# Patient Record
Sex: Female | Born: 1955
Health system: Southern US, Community
[De-identification: ages and names within clinical notes are randomized; demographics above are authoritative.]

---

## 2016-05-27 ENCOUNTER — Encounter: Payer: Self-pay | Admitting: Internal Medicine

## 2016-07-04 ENCOUNTER — Ambulatory Visit: Payer: Self-pay | Admitting: Internal Medicine

## 2020-08-07 DIAGNOSIS — R300 Dysuria: Secondary | ICD-10-CM | POA: Diagnosis not present

## 2020-08-15 DIAGNOSIS — Z1231 Encounter for screening mammogram for malignant neoplasm of breast: Secondary | ICD-10-CM | POA: Diagnosis not present

## 2020-09-05 DIAGNOSIS — M109 Gout, unspecified: Secondary | ICD-10-CM | POA: Diagnosis not present

## 2020-09-07 DIAGNOSIS — M109 Gout, unspecified: Secondary | ICD-10-CM | POA: Diagnosis not present

## 2020-09-07 DIAGNOSIS — L03031 Cellulitis of right toe: Secondary | ICD-10-CM | POA: Diagnosis not present

## 2020-09-07 DIAGNOSIS — R509 Fever, unspecified: Secondary | ICD-10-CM | POA: Diagnosis not present

## 2020-09-07 DIAGNOSIS — M7731 Calcaneal spur, right foot: Secondary | ICD-10-CM | POA: Diagnosis not present

## 2020-09-07 DIAGNOSIS — M79674 Pain in right toe(s): Secondary | ICD-10-CM | POA: Diagnosis not present

## 2020-09-11 DIAGNOSIS — G9389 Other specified disorders of brain: Secondary | ICD-10-CM | POA: Diagnosis not present

## 2020-09-11 DIAGNOSIS — R42 Dizziness and giddiness: Secondary | ICD-10-CM | POA: Diagnosis not present

## 2020-09-11 DIAGNOSIS — G319 Degenerative disease of nervous system, unspecified: Secondary | ICD-10-CM | POA: Diagnosis not present

## 2020-09-11 DIAGNOSIS — I10 Essential (primary) hypertension: Secondary | ICD-10-CM | POA: Diagnosis not present

## 2020-09-11 DIAGNOSIS — M109 Gout, unspecified: Secondary | ICD-10-CM | POA: Diagnosis not present

## 2020-09-12 DIAGNOSIS — R002 Palpitations: Secondary | ICD-10-CM | POA: Diagnosis not present

## 2020-09-12 DIAGNOSIS — I361 Nonrheumatic tricuspid (valve) insufficiency: Secondary | ICD-10-CM | POA: Diagnosis not present

## 2020-09-12 DIAGNOSIS — R42 Dizziness and giddiness: Secondary | ICD-10-CM | POA: Diagnosis not present

## 2020-09-12 DIAGNOSIS — I10 Essential (primary) hypertension: Secondary | ICD-10-CM | POA: Diagnosis not present

## 2020-09-12 DIAGNOSIS — I6523 Occlusion and stenosis of bilateral carotid arteries: Secondary | ICD-10-CM | POA: Diagnosis not present

## 2020-10-03 DIAGNOSIS — M79673 Pain in unspecified foot: Secondary | ICD-10-CM | POA: Diagnosis not present

## 2020-10-03 DIAGNOSIS — E11319 Type 2 diabetes mellitus with unspecified diabetic retinopathy without macular edema: Secondary | ICD-10-CM | POA: Diagnosis not present

## 2020-10-03 DIAGNOSIS — E785 Hyperlipidemia, unspecified: Secondary | ICD-10-CM | POA: Diagnosis not present

## 2020-10-12 ENCOUNTER — Ambulatory Visit: Payer: Medicare Other | Admitting: Podiatry

## 2020-10-12 ENCOUNTER — Ambulatory Visit: Payer: Medicare Other

## 2020-10-12 ENCOUNTER — Other Ambulatory Visit: Payer: Self-pay

## 2020-10-12 DIAGNOSIS — M109 Gout, unspecified: Secondary | ICD-10-CM

## 2020-10-12 DIAGNOSIS — M79675 Pain in left toe(s): Secondary | ICD-10-CM

## 2020-10-12 DIAGNOSIS — M7752 Other enthesopathy of left foot: Secondary | ICD-10-CM | POA: Diagnosis not present

## 2020-10-12 DIAGNOSIS — M1A0720 Idiopathic chronic gout, left ankle and foot, without tophus (tophi): Secondary | ICD-10-CM | POA: Diagnosis not present

## 2020-10-12 DIAGNOSIS — M7989 Other specified soft tissue disorders: Secondary | ICD-10-CM

## 2020-10-12 MED ORDER — BETAMETHASONE SOD PHOS & ACET 6 (3-3) MG/ML IJ SUSP
3.0000 mg | Freq: Once | INTRAMUSCULAR | Status: AC
  Administered 2020-10-12: 3 mg

## 2020-10-12 NOTE — Progress Notes (Signed)
  Subjective:  Patient ID: Megan Oliver, female    DOB: 10-10-1955,  MRN: [id number]  No chief complaint on file.   65 y.o. female presents with the above complaint. History confirmed with patient. Reports pain in the left big toe x1 week with associated bunion. Endorses hx of gout, has had it in both big toes before. Also complains of split along the end of the left big toe x6 months has attempted scraping it herself.  Objective:  Physical Exam: warm, good capillary refill, no trophic changes or ulcerative lesions, normal DP and PT pulses, and normal sensory exam. General xerosis Left Foot: POP left 1st MPJ with local warmth and erythema. Skin fissure with underlying healed skin upon debridement left hallux distal Right Foot: HAV with HPK along medial hallux.  Assessment:   1. Chronic gout of left foot, unspecified cause   2. Capsulitis of metatarsophalangeal (MTP) joint of left foot   3. Pain and swelling of toe of left foot      Plan:  Patient was evaluated and treated and all questions answered.  Gout left foot -Educated on etiology -XR from [facility] reviewed with patient -Injection delivered to the painful joint  Procedure: Joint Injection Location: Left 1st MP joint Skin Prep: Alcohol. Injectate: 0.5 cc 1% lidocaine plain, 0.5 cc betamethasone acetate-betamethasone sodium phosphate Disposition: Patient tolerated procedure well. Injection site dressed with a band-aid.  Return in about 4 weeks (around 11/09/2020) for Gout f/u.

## 2020-10-12 NOTE — Patient Instructions (Signed)
Gout  Gout is a condition that causes painful swelling of the joints. Gout is a type of inflammation of the joints (arthritis). This condition is caused by having too much uric acid in the body. Uric acid is a chemical that forms when the body breaks down substances called purines.Purines are important for building body proteins. When the body has too much uric acid, sharp crystals can form and build up inside the joints. This causes pain and swelling. Gout attacks can happen quickly and may be very painful (acute gout). Over time, the attacks can affect more joints and become more frequent (chronic gout). Gout can also cause uric acid to build up under the skin and inside thekidneys. What are the causes? This condition is caused by too much uric acid in your blood. This can happen because: Your kidneys do not remove enough uric acid from your blood. This is the most common cause. Your body makes too much uric acid. This can happen with some cancers and cancer treatments. It can also occur if your body is breaking down too many red blood cells (hemolytic anemia). You eat too many foods that are high in purines. These foods include organ meats and some seafood. Alcohol, especially beer, is also high in purines. A gout attack may be triggered by trauma or stress. What increases the risk? You are more likely to develop this condition if you: Have a family history of gout. Are female and middle-aged. Are female and have gone through menopause. Are obese. Frequently drink alcohol, especially beer. Are dehydrated. Lose weight too quickly. Have an organ transplant. Have lead poisoning. Take certain medicines, including aspirin, cyclosporine, diuretics, levodopa, and niacin. Have kidney disease. Have a skin condition called psoriasis. What are the signs or symptoms? An attack of acute gout happens quickly. It usually occurs in just one joint. The most common place is the big toe. Attacks often start  at night. Other joints that may be affected include joints of the feet, ankle, knee, fingers, wrist, or elbow. Symptoms of this condition may include: Severe pain. Warmth. Swelling. Stiffness. Tenderness. The affected joint may be very painful to touch. Shiny, red, or purple skin. Chills and fever. Chronic gout may cause symptoms more frequently. More joints may be involved. You may also have white or yellow lumps (tophi) on your hands or feet or in other areas near your joints. How is this diagnosed? This condition is diagnosed based on your symptoms, medical history, and physical exam. You may have tests, such as: Blood tests to measure uric acid levels. Removal of joint fluid with a thin needle (aspiration) to look for uric acid crystals. X-rays to look for joint damage. How is this treated? Treatment for this condition has two phases: treating an acute attack and preventing future attacks. Acute gout treatment may include medicines to reduce pain and swelling, including: NSAIDs. Steroids. These are strong anti-inflammatory medicines that can be taken by mouth (orally) or injected into a joint. Colchicine. This medicine relieves pain and swelling when it is taken soon after an attack. It can be given by mouth or through an IV. Preventive treatment may include: Daily use of smaller doses of NSAIDs or colchicine. Use of a medicine that reduces uric acid levels in your blood. Changes to your diet. You may need to see a dietitian about what to eat and drink to prevent gout. Follow these instructions at home: During a gout attack  If directed, put ice on the affected area: Put   ice in a plastic bag. Place a towel between your skin and the bag. Leave the ice on for 20 minutes, 2-3 times a day. Raise (elevate) the affected joint above the level of your heart as often as possible. Rest the joint as much as possible. If the affected joint is in your leg, you may be given crutches to  use. Follow instructions from your health care provider about eating or drinking restrictions.  Avoiding future gout attacks Follow a low-purine diet as told by your dietitian or health care provider. Avoid foods and drinks that are high in purines, including liver, kidney, anchovies, asparagus, herring, mushrooms, mussels, and beer. Maintain a healthy weight or lose weight if you are overweight. If you want to lose weight, talk with your health care provider. It is important that you do not lose weight too quickly. Start or maintain an exercise program as told by your health care provider. Eating and drinking Drink enough fluids to keep your urine pale yellow. If you drink alcohol: Limit how much you use to: 0-1 drink a day for women. 0-2 drinks a day for men. Be aware of how much alcohol is in your drink. In the [place] one drink equals one 12 oz bottle of beer (355 mL) one 5 oz glass of wine (148 mL), or one 1 oz glass of hard liquor (44 mL). General instructions Take over-the-counter and prescription medicines only as told by your health care provider. Do not drive or use heavy machinery while taking prescription pain medicine. Return to your normal activities as told by your health care provider. Ask your health care provider what activities are safe for you. Keep all follow-up visits as told by your health care provider. This is important. Contact a health care provider if you have: Another gout attack. Continuing symptoms of a gout attack after 10 days of treatment. Side effects from your medicines. Chills or a fever. Burning pain when you urinate. Pain in your lower back or belly. Get help right away if you: Have severe or uncontrolled pain. Cannot urinate. Summary Gout is painful swelling of the joints caused by inflammation. The most common site of pain is the big toe, but it can affect other joints in the body. Medicines and dietary changes can help to prevent and treat gout  attacks. This information is not intended to replace advice given to you by your health care provider. Make sure you discuss any questions you have with your healthcare provider. Document Revised: 09/10/2017 Document Reviewed: 09/10/2017 Elsevier Patient Education  2022 [employer]  

## 2020-10-17 DIAGNOSIS — E1165 Type 2 diabetes mellitus with hyperglycemia: Secondary | ICD-10-CM | POA: Diagnosis not present

## 2020-10-18 ENCOUNTER — Telehealth: Payer: Self-pay | Admitting: Podiatry

## 2020-10-18 DIAGNOSIS — M109 Gout, unspecified: Secondary | ICD-10-CM | POA: Diagnosis not present

## 2020-10-18 NOTE — Telephone Encounter (Signed)
Left foot injection did not help-pt would like to know what next option is  [facility]

## 2020-10-19 NOTE — Telephone Encounter (Signed)
Recc continued icing to the area, elevating. She can soak in warm water and epsom salt soaks. Cannot do other medication right now but we could consider repeat injection at next visit. We will discuss when I see her

## 2020-10-20 NOTE — Telephone Encounter (Signed)
Pt notified and understands/reb °

## 2020-11-09 ENCOUNTER — Ambulatory Visit (INDEPENDENT_AMBULATORY_CARE_PROVIDER_SITE_OTHER): Payer: Medicare Other | Admitting: Podiatry

## 2020-11-09 DIAGNOSIS — Z5329 Procedure and treatment not carried out because of patient's decision for other reasons: Secondary | ICD-10-CM

## 2020-11-22 DIAGNOSIS — M109 Gout, unspecified: Secondary | ICD-10-CM | POA: Diagnosis not present

## 2020-11-22 DIAGNOSIS — Z79899 Other long term (current) drug therapy: Secondary | ICD-10-CM | POA: Diagnosis not present

## 2021-01-31 DIAGNOSIS — R509 Fever, unspecified: Secondary | ICD-10-CM | POA: Diagnosis not present

## 2021-01-31 DIAGNOSIS — R059 Cough, unspecified: Secondary | ICD-10-CM | POA: Diagnosis not present

## 2021-01-31 DIAGNOSIS — J019 Acute sinusitis, unspecified: Secondary | ICD-10-CM | POA: Diagnosis not present

## 2021-02-01 DIAGNOSIS — E1165 Type 2 diabetes mellitus with hyperglycemia: Secondary | ICD-10-CM | POA: Diagnosis not present

## 2021-03-05 DIAGNOSIS — Z03818 Encounter for observation for suspected exposure to other biological agents ruled out: Secondary | ICD-10-CM | POA: Diagnosis not present

## 2021-03-05 DIAGNOSIS — Z20822 Contact with and (suspected) exposure to covid-19: Secondary | ICD-10-CM | POA: Diagnosis not present

## 2021-04-02 DIAGNOSIS — R82991 Hypocitraturia: Secondary | ICD-10-CM | POA: Diagnosis not present

## 2021-04-02 DIAGNOSIS — I129 Hypertensive chronic kidney disease with stage 1 through stage 4 chronic kidney disease, or unspecified chronic kidney disease: Secondary | ICD-10-CM | POA: Diagnosis not present

## 2021-04-02 DIAGNOSIS — E790 Hyperuricemia without signs of inflammatory arthritis and tophaceous disease: Secondary | ICD-10-CM | POA: Diagnosis not present

## 2021-04-02 DIAGNOSIS — N390 Urinary tract infection, site not specified: Secondary | ICD-10-CM | POA: Diagnosis not present

## 2021-04-02 DIAGNOSIS — Z87442 Personal history of urinary calculi: Secondary | ICD-10-CM | POA: Diagnosis not present

## 2021-04-02 DIAGNOSIS — N1832 Chronic kidney disease, stage 3b: Secondary | ICD-10-CM | POA: Diagnosis not present

## 2021-04-03 ENCOUNTER — Other Ambulatory Visit: Payer: Self-pay | Admitting: Nephrology

## 2021-04-03 DIAGNOSIS — R82991 Hypocitraturia: Secondary | ICD-10-CM

## 2021-04-03 DIAGNOSIS — Z87442 Personal history of urinary calculi: Secondary | ICD-10-CM

## 2021-04-03 DIAGNOSIS — N1832 Chronic kidney disease, stage 3b: Secondary | ICD-10-CM

## 2021-04-09 DIAGNOSIS — M109 Gout, unspecified: Secondary | ICD-10-CM | POA: Diagnosis not present

## 2021-04-09 DIAGNOSIS — I10 Essential (primary) hypertension: Secondary | ICD-10-CM | POA: Diagnosis not present

## 2021-04-09 DIAGNOSIS — N1832 Chronic kidney disease, stage 3b: Secondary | ICD-10-CM | POA: Diagnosis not present

## 2021-04-09 DIAGNOSIS — E785 Hyperlipidemia, unspecified: Secondary | ICD-10-CM | POA: Diagnosis not present

## 2021-04-09 DIAGNOSIS — E039 Hypothyroidism, unspecified: Secondary | ICD-10-CM | POA: Diagnosis not present

## 2021-04-09 DIAGNOSIS — M79605 Pain in left leg: Secondary | ICD-10-CM | POA: Diagnosis not present

## 2021-04-09 DIAGNOSIS — E11319 Type 2 diabetes mellitus with unspecified diabetic retinopathy without macular edema: Secondary | ICD-10-CM | POA: Diagnosis not present

## 2021-04-12 ENCOUNTER — Ambulatory Visit
Admission: RE | Admit: 2021-04-12 | Discharge: 2021-04-12 | Disposition: A | Discharge status: Home / Self Care | Payer: Medicare Other | Source: Ambulatory Visit | Attending: Nephrology | Admitting: Nephrology

## 2021-04-12 DIAGNOSIS — N200 Calculus of kidney: Secondary | ICD-10-CM | POA: Diagnosis not present

## 2021-04-12 DIAGNOSIS — Z87442 Personal history of urinary calculi: Secondary | ICD-10-CM

## 2021-04-12 DIAGNOSIS — N261 Atrophy of kidney (terminal): Secondary | ICD-10-CM | POA: Diagnosis not present

## 2021-04-12 DIAGNOSIS — N1832 Chronic kidney disease, stage 3b: Secondary | ICD-10-CM

## 2021-04-12 DIAGNOSIS — R82991 Hypocitraturia: Secondary | ICD-10-CM

## 2021-04-12 DIAGNOSIS — N281 Cyst of kidney, acquired: Secondary | ICD-10-CM | POA: Diagnosis not present

## 2021-06-19 DIAGNOSIS — E1165 Type 2 diabetes mellitus with hyperglycemia: Secondary | ICD-10-CM | POA: Diagnosis not present

## 2021-07-05 DIAGNOSIS — D485 Neoplasm of uncertain behavior of skin: Secondary | ICD-10-CM | POA: Diagnosis not present

## 2021-07-05 DIAGNOSIS — L7211 Pilar cyst: Secondary | ICD-10-CM | POA: Diagnosis not present

## 2021-08-15 DIAGNOSIS — E785 Hyperlipidemia, unspecified: Secondary | ICD-10-CM | POA: Diagnosis not present

## 2021-08-15 DIAGNOSIS — M109 Gout, unspecified: Secondary | ICD-10-CM | POA: Diagnosis not present

## 2021-08-15 DIAGNOSIS — E11319 Type 2 diabetes mellitus with unspecified diabetic retinopathy without macular edema: Secondary | ICD-10-CM | POA: Diagnosis not present

## 2021-08-15 DIAGNOSIS — N1832 Chronic kidney disease, stage 3b: Secondary | ICD-10-CM | POA: Diagnosis not present

## 2021-08-15 DIAGNOSIS — E039 Hypothyroidism, unspecified: Secondary | ICD-10-CM | POA: Diagnosis not present

## 2021-08-15 DIAGNOSIS — I10 Essential (primary) hypertension: Secondary | ICD-10-CM | POA: Diagnosis not present

## 2021-08-15 DIAGNOSIS — M25511 Pain in right shoulder: Secondary | ICD-10-CM | POA: Diagnosis not present

## 2021-08-15 DIAGNOSIS — Z1231 Encounter for screening mammogram for malignant neoplasm of breast: Secondary | ICD-10-CM | POA: Diagnosis not present

## 2021-08-15 DIAGNOSIS — Z0000 Encounter for general adult medical examination without abnormal findings: Secondary | ICD-10-CM | POA: Diagnosis not present

## 2021-09-11 DIAGNOSIS — M25511 Pain in right shoulder: Secondary | ICD-10-CM | POA: Diagnosis not present

## 2021-09-18 DIAGNOSIS — E1165 Type 2 diabetes mellitus with hyperglycemia: Secondary | ICD-10-CM | POA: Diagnosis not present

## 2021-10-17 DIAGNOSIS — M810 Age-related osteoporosis without current pathological fracture: Secondary | ICD-10-CM | POA: Diagnosis not present

## 2021-10-17 DIAGNOSIS — Z1231 Encounter for screening mammogram for malignant neoplasm of breast: Secondary | ICD-10-CM | POA: Diagnosis not present

## 2021-11-01 DIAGNOSIS — M109 Gout, unspecified: Secondary | ICD-10-CM | POA: Diagnosis not present

## 2021-11-01 DIAGNOSIS — Z87442 Personal history of urinary calculi: Secondary | ICD-10-CM | POA: Diagnosis not present

## 2021-11-01 DIAGNOSIS — N390 Urinary tract infection, site not specified: Secondary | ICD-10-CM | POA: Diagnosis not present

## 2021-11-01 DIAGNOSIS — N1832 Chronic kidney disease, stage 3b: Secondary | ICD-10-CM | POA: Diagnosis not present

## 2021-11-01 DIAGNOSIS — I129 Hypertensive chronic kidney disease with stage 1 through stage 4 chronic kidney disease, or unspecified chronic kidney disease: Secondary | ICD-10-CM | POA: Diagnosis not present

## 2021-11-01 DIAGNOSIS — E1122 Type 2 diabetes mellitus with diabetic chronic kidney disease: Secondary | ICD-10-CM | POA: Diagnosis not present

## 2021-11-06 DIAGNOSIS — J069 Acute upper respiratory infection, unspecified: Secondary | ICD-10-CM | POA: Diagnosis not present

## 2021-11-06 DIAGNOSIS — M810 Age-related osteoporosis without current pathological fracture: Secondary | ICD-10-CM | POA: Diagnosis not present

## 2021-11-06 DIAGNOSIS — N390 Urinary tract infection, site not specified: Secondary | ICD-10-CM | POA: Diagnosis not present

## 2021-11-06 DIAGNOSIS — R300 Dysuria: Secondary | ICD-10-CM | POA: Diagnosis not present

## 2021-11-06 DIAGNOSIS — R209 Unspecified disturbances of skin sensation: Secondary | ICD-10-CM | POA: Diagnosis not present

## 2021-11-06 DIAGNOSIS — E039 Hypothyroidism, unspecified: Secondary | ICD-10-CM | POA: Diagnosis not present

## 2021-11-06 DIAGNOSIS — Z9884 Bariatric surgery status: Secondary | ICD-10-CM | POA: Diagnosis not present

## 2021-11-06 DIAGNOSIS — R059 Cough, unspecified: Secondary | ICD-10-CM | POA: Diagnosis not present

## 2021-11-08 DIAGNOSIS — Z87442 Personal history of urinary calculi: Secondary | ICD-10-CM | POA: Diagnosis not present

## 2022-01-01 DIAGNOSIS — R0981 Nasal congestion: Secondary | ICD-10-CM | POA: Diagnosis not present

## 2022-01-01 DIAGNOSIS — R519 Headache, unspecified: Secondary | ICD-10-CM | POA: Diagnosis not present

## 2022-01-01 DIAGNOSIS — J019 Acute sinusitis, unspecified: Secondary | ICD-10-CM | POA: Diagnosis not present

## 2022-01-01 DIAGNOSIS — R051 Acute cough: Secondary | ICD-10-CM | POA: Diagnosis not present

## 2022-01-02 DIAGNOSIS — E119 Type 2 diabetes mellitus without complications: Secondary | ICD-10-CM | POA: Diagnosis not present

## 2022-01-31 DIAGNOSIS — E785 Hyperlipidemia, unspecified: Secondary | ICD-10-CM | POA: Diagnosis not present

## 2022-01-31 DIAGNOSIS — E113393 Type 2 diabetes mellitus with moderate nonproliferative diabetic retinopathy without macular edema, bilateral: Secondary | ICD-10-CM | POA: Diagnosis not present

## 2022-01-31 DIAGNOSIS — H524 Presbyopia: Secondary | ICD-10-CM | POA: Diagnosis not present

## 2022-01-31 DIAGNOSIS — E039 Hypothyroidism, unspecified: Secondary | ICD-10-CM | POA: Diagnosis not present

## 2022-01-31 DIAGNOSIS — E11319 Type 2 diabetes mellitus with unspecified diabetic retinopathy without macular edema: Secondary | ICD-10-CM | POA: Diagnosis not present

## 2022-03-14 DIAGNOSIS — E113291 Type 2 diabetes mellitus with mild nonproliferative diabetic retinopathy without macular edema, right eye: Secondary | ICD-10-CM | POA: Diagnosis not present

## 2022-03-14 DIAGNOSIS — E113212 Type 2 diabetes mellitus with mild nonproliferative diabetic retinopathy with macular edema, left eye: Secondary | ICD-10-CM | POA: Diagnosis not present

## 2022-03-14 DIAGNOSIS — H3589 Other specified retinal disorders: Secondary | ICD-10-CM | POA: Diagnosis not present

## 2022-03-14 DIAGNOSIS — H2513 Age-related nuclear cataract, bilateral: Secondary | ICD-10-CM | POA: Diagnosis not present

## 2022-03-20 DIAGNOSIS — Z9884 Bariatric surgery status: Secondary | ICD-10-CM | POA: Diagnosis not present

## 2022-03-20 DIAGNOSIS — E039 Hypothyroidism, unspecified: Secondary | ICD-10-CM | POA: Diagnosis not present

## 2022-03-20 DIAGNOSIS — Z79899 Other long term (current) drug therapy: Secondary | ICD-10-CM | POA: Diagnosis not present

## 2022-03-20 DIAGNOSIS — E785 Hyperlipidemia, unspecified: Secondary | ICD-10-CM | POA: Diagnosis not present

## 2022-03-20 DIAGNOSIS — N1832 Chronic kidney disease, stage 3b: Secondary | ICD-10-CM | POA: Diagnosis not present

## 2022-03-28 DIAGNOSIS — E538 Deficiency of other specified B group vitamins: Secondary | ICD-10-CM | POA: Diagnosis not present

## 2022-04-08 DIAGNOSIS — E1165 Type 2 diabetes mellitus with hyperglycemia: Secondary | ICD-10-CM | POA: Diagnosis not present

## 2022-04-16 DIAGNOSIS — E11319 Type 2 diabetes mellitus with unspecified diabetic retinopathy without macular edema: Secondary | ICD-10-CM | POA: Diagnosis not present

## 2022-04-16 DIAGNOSIS — B372 Candidiasis of skin and nail: Secondary | ICD-10-CM | POA: Diagnosis not present

## 2022-04-25 DIAGNOSIS — E538 Deficiency of other specified B group vitamins: Secondary | ICD-10-CM | POA: Diagnosis not present

## 2022-05-06 DIAGNOSIS — H02831 Dermatochalasis of right upper eyelid: Secondary | ICD-10-CM | POA: Diagnosis not present

## 2022-05-06 DIAGNOSIS — H02834 Dermatochalasis of left upper eyelid: Secondary | ICD-10-CM | POA: Diagnosis not present

## 2022-05-29 DIAGNOSIS — E785 Hyperlipidemia, unspecified: Secondary | ICD-10-CM | POA: Diagnosis not present

## 2022-05-29 DIAGNOSIS — M109 Gout, unspecified: Secondary | ICD-10-CM | POA: Diagnosis not present

## 2022-05-29 DIAGNOSIS — E039 Hypothyroidism, unspecified: Secondary | ICD-10-CM | POA: Diagnosis not present

## 2022-05-29 DIAGNOSIS — I10 Essential (primary) hypertension: Secondary | ICD-10-CM | POA: Diagnosis not present

## 2022-05-29 DIAGNOSIS — N1832 Chronic kidney disease, stage 3b: Secondary | ICD-10-CM | POA: Diagnosis not present

## 2022-06-21 DIAGNOSIS — H02831 Dermatochalasis of right upper eyelid: Secondary | ICD-10-CM | POA: Diagnosis not present

## 2022-06-21 DIAGNOSIS — E119 Type 2 diabetes mellitus without complications: Secondary | ICD-10-CM | POA: Diagnosis not present

## 2022-06-21 DIAGNOSIS — Z01818 Encounter for other preprocedural examination: Secondary | ICD-10-CM | POA: Diagnosis not present

## 2022-06-21 DIAGNOSIS — H53453 Other localized visual field defect, bilateral: Secondary | ICD-10-CM | POA: Diagnosis not present

## 2022-06-21 DIAGNOSIS — H02834 Dermatochalasis of left upper eyelid: Secondary | ICD-10-CM | POA: Diagnosis not present

## 2022-06-24 DIAGNOSIS — N390 Urinary tract infection, site not specified: Secondary | ICD-10-CM | POA: Diagnosis not present

## 2022-06-24 DIAGNOSIS — E785 Hyperlipidemia, unspecified: Secondary | ICD-10-CM | POA: Diagnosis not present

## 2022-06-24 DIAGNOSIS — E1122 Type 2 diabetes mellitus with diabetic chronic kidney disease: Secondary | ICD-10-CM | POA: Diagnosis not present

## 2022-06-24 DIAGNOSIS — Z87442 Personal history of urinary calculi: Secondary | ICD-10-CM | POA: Diagnosis not present

## 2022-06-24 DIAGNOSIS — N1832 Chronic kidney disease, stage 3b: Secondary | ICD-10-CM | POA: Diagnosis not present

## 2022-06-24 DIAGNOSIS — R82991 Hypocitraturia: Secondary | ICD-10-CM | POA: Diagnosis not present

## 2022-06-24 DIAGNOSIS — I129 Hypertensive chronic kidney disease with stage 1 through stage 4 chronic kidney disease, or unspecified chronic kidney disease: Secondary | ICD-10-CM | POA: Diagnosis not present

## 2022-07-02 DIAGNOSIS — F331 Major depressive disorder, recurrent, moderate: Secondary | ICD-10-CM | POA: Diagnosis not present

## 2022-07-02 DIAGNOSIS — E538 Deficiency of other specified B group vitamins: Secondary | ICD-10-CM | POA: Diagnosis not present

## 2022-07-02 DIAGNOSIS — Z6833 Body mass index (BMI) 33.0-33.9, adult: Secondary | ICD-10-CM | POA: Diagnosis not present

## 2022-07-08 DIAGNOSIS — E1165 Type 2 diabetes mellitus with hyperglycemia: Secondary | ICD-10-CM | POA: Diagnosis not present

## 2022-09-26 DIAGNOSIS — N8111 Cystocele, midline: Secondary | ICD-10-CM | POA: Diagnosis not present

## 2022-09-26 DIAGNOSIS — Z6832 Body mass index (BMI) 32.0-32.9, adult: Secondary | ICD-10-CM | POA: Diagnosis not present

## 2022-09-26 DIAGNOSIS — N3941 Urge incontinence: Secondary | ICD-10-CM | POA: Diagnosis not present

## 2022-10-08 DIAGNOSIS — E1165 Type 2 diabetes mellitus with hyperglycemia: Secondary | ICD-10-CM | POA: Diagnosis not present

## 2022-11-06 DIAGNOSIS — E039 Hypothyroidism, unspecified: Secondary | ICD-10-CM | POA: Diagnosis not present

## 2022-11-06 DIAGNOSIS — M109 Gout, unspecified: Secondary | ICD-10-CM | POA: Diagnosis not present

## 2022-11-06 DIAGNOSIS — E785 Hyperlipidemia, unspecified: Secondary | ICD-10-CM | POA: Diagnosis not present

## 2022-11-06 DIAGNOSIS — Z79899 Other long term (current) drug therapy: Secondary | ICD-10-CM | POA: Diagnosis not present

## 2022-11-06 DIAGNOSIS — Z1331 Encounter for screening for depression: Secondary | ICD-10-CM | POA: Diagnosis not present

## 2022-11-06 DIAGNOSIS — I10 Essential (primary) hypertension: Secondary | ICD-10-CM | POA: Diagnosis not present

## 2022-11-06 DIAGNOSIS — E559 Vitamin D deficiency, unspecified: Secondary | ICD-10-CM | POA: Diagnosis not present

## 2022-11-06 DIAGNOSIS — E11319 Type 2 diabetes mellitus with unspecified diabetic retinopathy without macular edema: Secondary | ICD-10-CM | POA: Diagnosis not present

## 2022-11-06 DIAGNOSIS — Z1231 Encounter for screening mammogram for malignant neoplasm of breast: Secondary | ICD-10-CM | POA: Diagnosis not present

## 2022-11-06 DIAGNOSIS — E538 Deficiency of other specified B group vitamins: Secondary | ICD-10-CM | POA: Diagnosis not present

## 2022-11-06 DIAGNOSIS — Z0000 Encounter for general adult medical examination without abnormal findings: Secondary | ICD-10-CM | POA: Diagnosis not present

## 2022-11-06 DIAGNOSIS — N390 Urinary tract infection, site not specified: Secondary | ICD-10-CM | POA: Diagnosis not present

## 2022-11-28 DIAGNOSIS — I700 Atherosclerosis of aorta: Secondary | ICD-10-CM | POA: Diagnosis not present

## 2022-11-28 DIAGNOSIS — Z87442 Personal history of urinary calculi: Secondary | ICD-10-CM | POA: Diagnosis not present

## 2022-11-28 DIAGNOSIS — N390 Urinary tract infection, site not specified: Secondary | ICD-10-CM | POA: Diagnosis not present

## 2022-11-28 DIAGNOSIS — R109 Unspecified abdominal pain: Secondary | ICD-10-CM | POA: Diagnosis not present

## 2022-11-28 DIAGNOSIS — N200 Calculus of kidney: Secondary | ICD-10-CM | POA: Diagnosis not present

## 2022-11-28 DIAGNOSIS — Q614 Renal dysplasia: Secondary | ICD-10-CM | POA: Diagnosis not present

## 2022-12-23 DIAGNOSIS — Z6833 Body mass index (BMI) 33.0-33.9, adult: Secondary | ICD-10-CM | POA: Diagnosis not present

## 2022-12-23 DIAGNOSIS — R051 Acute cough: Secondary | ICD-10-CM | POA: Diagnosis not present

## 2022-12-24 DIAGNOSIS — N390 Urinary tract infection, site not specified: Secondary | ICD-10-CM | POA: Diagnosis not present

## 2022-12-24 DIAGNOSIS — Z87442 Personal history of urinary calculi: Secondary | ICD-10-CM | POA: Diagnosis not present

## 2022-12-24 DIAGNOSIS — E1122 Type 2 diabetes mellitus with diabetic chronic kidney disease: Secondary | ICD-10-CM | POA: Diagnosis not present

## 2022-12-24 DIAGNOSIS — E785 Hyperlipidemia, unspecified: Secondary | ICD-10-CM | POA: Diagnosis not present

## 2022-12-24 DIAGNOSIS — N1832 Chronic kidney disease, stage 3b: Secondary | ICD-10-CM | POA: Diagnosis not present

## 2022-12-24 DIAGNOSIS — I129 Hypertensive chronic kidney disease with stage 1 through stage 4 chronic kidney disease, or unspecified chronic kidney disease: Secondary | ICD-10-CM | POA: Diagnosis not present

## 2022-12-24 DIAGNOSIS — R82991 Hypocitraturia: Secondary | ICD-10-CM | POA: Diagnosis not present

## 2022-12-27 DIAGNOSIS — N200 Calculus of kidney: Secondary | ICD-10-CM | POA: Diagnosis not present

## 2022-12-27 DIAGNOSIS — N3020 Other chronic cystitis without hematuria: Secondary | ICD-10-CM | POA: Diagnosis not present

## 2023-01-07 DIAGNOSIS — Z794 Long term (current) use of insulin: Secondary | ICD-10-CM | POA: Diagnosis not present

## 2023-01-07 DIAGNOSIS — E1165 Type 2 diabetes mellitus with hyperglycemia: Secondary | ICD-10-CM | POA: Diagnosis not present

## 2023-02-11 DIAGNOSIS — S51811A Laceration without foreign body of right forearm, initial encounter: Secondary | ICD-10-CM | POA: Diagnosis not present

## 2023-02-11 DIAGNOSIS — Z6833 Body mass index (BMI) 33.0-33.9, adult: Secondary | ICD-10-CM | POA: Diagnosis not present

## 2023-03-12 DIAGNOSIS — M109 Gout, unspecified: Secondary | ICD-10-CM | POA: Diagnosis not present

## 2023-03-12 DIAGNOSIS — E785 Hyperlipidemia, unspecified: Secondary | ICD-10-CM | POA: Diagnosis not present

## 2023-03-12 DIAGNOSIS — E11319 Type 2 diabetes mellitus with unspecified diabetic retinopathy without macular edema: Secondary | ICD-10-CM | POA: Diagnosis not present

## 2023-03-12 DIAGNOSIS — N1832 Chronic kidney disease, stage 3b: Secondary | ICD-10-CM | POA: Diagnosis not present

## 2023-03-12 DIAGNOSIS — I10 Essential (primary) hypertension: Secondary | ICD-10-CM | POA: Diagnosis not present

## 2023-03-12 DIAGNOSIS — E113393 Type 2 diabetes mellitus with moderate nonproliferative diabetic retinopathy without macular edema, bilateral: Secondary | ICD-10-CM | POA: Diagnosis not present

## 2023-03-12 DIAGNOSIS — E039 Hypothyroidism, unspecified: Secondary | ICD-10-CM | POA: Diagnosis not present

## 2023-03-12 DIAGNOSIS — I700 Atherosclerosis of aorta: Secondary | ICD-10-CM | POA: Diagnosis not present

## 2023-03-12 DIAGNOSIS — J329 Chronic sinusitis, unspecified: Secondary | ICD-10-CM | POA: Diagnosis not present

## 2023-03-19 DIAGNOSIS — N3020 Other chronic cystitis without hematuria: Secondary | ICD-10-CM | POA: Diagnosis not present

## 2023-03-19 DIAGNOSIS — N200 Calculus of kidney: Secondary | ICD-10-CM | POA: Diagnosis not present

## 2023-04-10 DIAGNOSIS — E1165 Type 2 diabetes mellitus with hyperglycemia: Secondary | ICD-10-CM | POA: Diagnosis not present

## 2023-04-10 DIAGNOSIS — Z794 Long term (current) use of insulin: Secondary | ICD-10-CM | POA: Diagnosis not present

## 2023-05-29 DIAGNOSIS — M549 Dorsalgia, unspecified: Secondary | ICD-10-CM | POA: Diagnosis not present

## 2023-05-29 DIAGNOSIS — N3000 Acute cystitis without hematuria: Secondary | ICD-10-CM | POA: Diagnosis not present

## 2023-05-29 DIAGNOSIS — I739 Peripheral vascular disease, unspecified: Secondary | ICD-10-CM | POA: Diagnosis not present

## 2023-06-10 DIAGNOSIS — I739 Peripheral vascular disease, unspecified: Secondary | ICD-10-CM | POA: Diagnosis not present

## 2023-07-02 DIAGNOSIS — E1122 Type 2 diabetes mellitus with diabetic chronic kidney disease: Secondary | ICD-10-CM | POA: Diagnosis not present

## 2023-07-02 DIAGNOSIS — N1832 Chronic kidney disease, stage 3b: Secondary | ICD-10-CM | POA: Diagnosis not present

## 2023-07-02 DIAGNOSIS — R82991 Hypocitraturia: Secondary | ICD-10-CM | POA: Diagnosis not present

## 2023-07-02 DIAGNOSIS — Z87442 Personal history of urinary calculi: Secondary | ICD-10-CM | POA: Diagnosis not present

## 2023-07-02 DIAGNOSIS — M109 Gout, unspecified: Secondary | ICD-10-CM | POA: Diagnosis not present

## 2023-07-02 DIAGNOSIS — E785 Hyperlipidemia, unspecified: Secondary | ICD-10-CM | POA: Diagnosis not present

## 2023-07-10 DIAGNOSIS — E1165 Type 2 diabetes mellitus with hyperglycemia: Secondary | ICD-10-CM | POA: Diagnosis not present

## 2023-07-10 DIAGNOSIS — Z794 Long term (current) use of insulin: Secondary | ICD-10-CM | POA: Diagnosis not present

## 2023-07-16 DIAGNOSIS — E785 Hyperlipidemia, unspecified: Secondary | ICD-10-CM | POA: Diagnosis not present

## 2023-07-16 DIAGNOSIS — I10 Essential (primary) hypertension: Secondary | ICD-10-CM | POA: Diagnosis not present

## 2023-07-16 DIAGNOSIS — E039 Hypothyroidism, unspecified: Secondary | ICD-10-CM | POA: Diagnosis not present

## 2023-07-16 DIAGNOSIS — R0683 Snoring: Secondary | ICD-10-CM | POA: Diagnosis not present

## 2023-07-16 DIAGNOSIS — E11319 Type 2 diabetes mellitus with unspecified diabetic retinopathy without macular edema: Secondary | ICD-10-CM | POA: Diagnosis not present

## 2023-07-16 DIAGNOSIS — R002 Palpitations: Secondary | ICD-10-CM | POA: Diagnosis not present

## 2023-08-10 DIAGNOSIS — G4733 Obstructive sleep apnea (adult) (pediatric): Secondary | ICD-10-CM | POA: Diagnosis not present

## 2023-08-28 IMAGING — US US RENAL
1 series · 14 of 25 positions shown · non-contrast
Comparison: Abdominal CT 11/25/2014

CLINICAL DATA: Chronic kidney disease stage 3b/A1. history of
kidney stones. Hypocitraturia.

EXAM:
RENAL / URINARY TRACT ULTRASOUND COMPLETE

[Series 1: us renal · 14 of 40 slices shown]
[im 1/40]
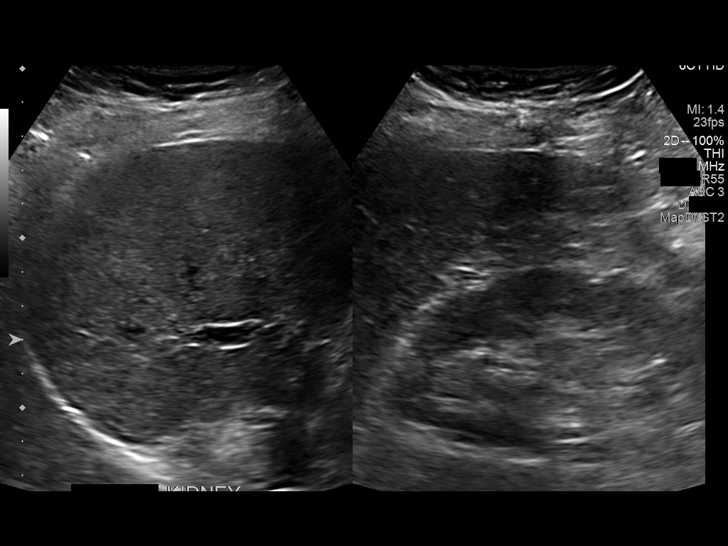
[im 4/40]
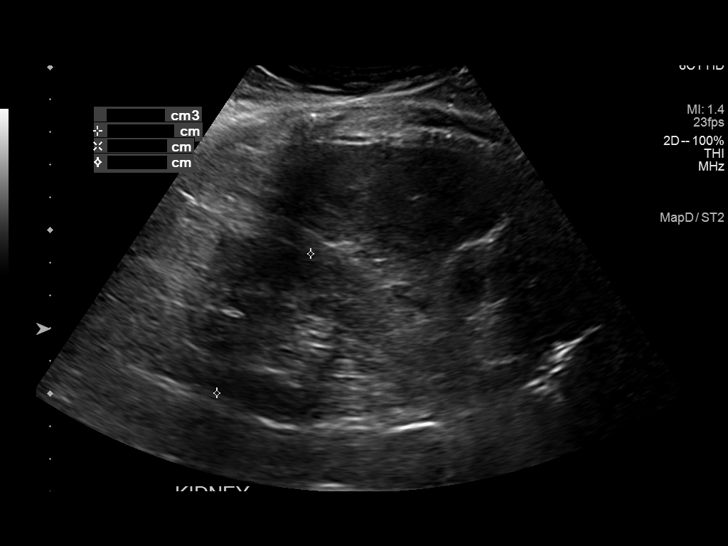
[im 7/40]
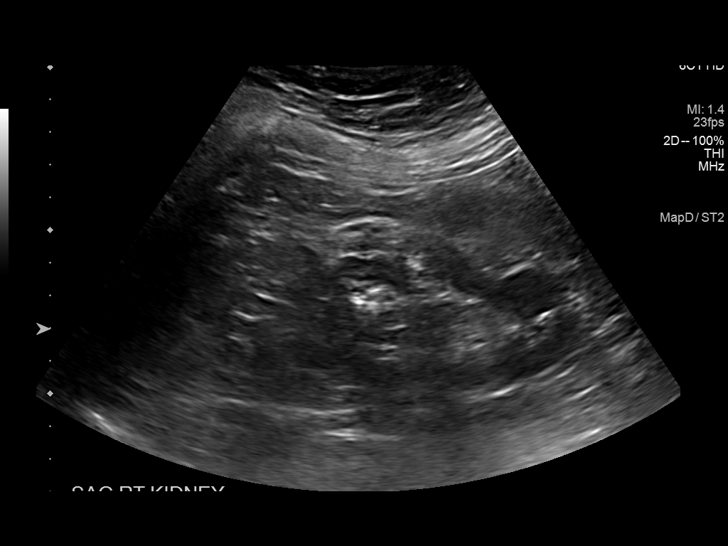
[im 10/40]
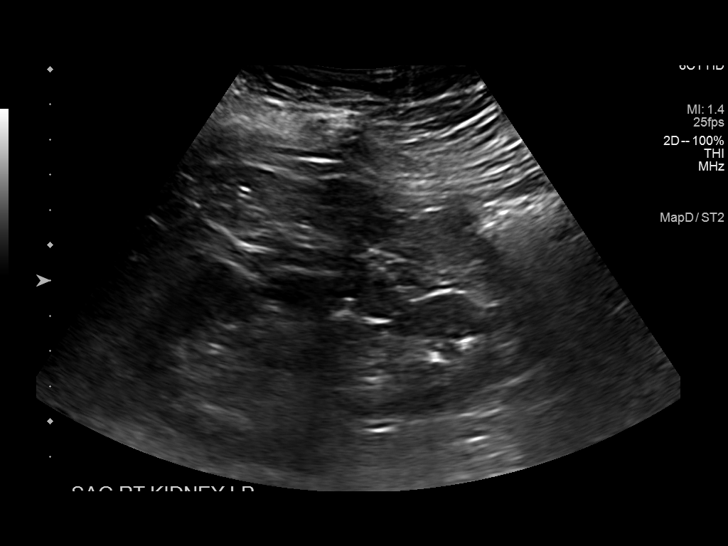
[im 14/40]
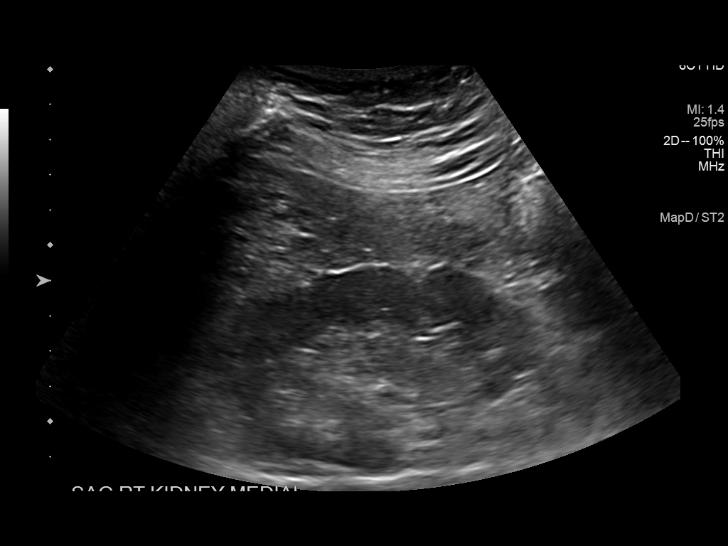
[im 15/40]
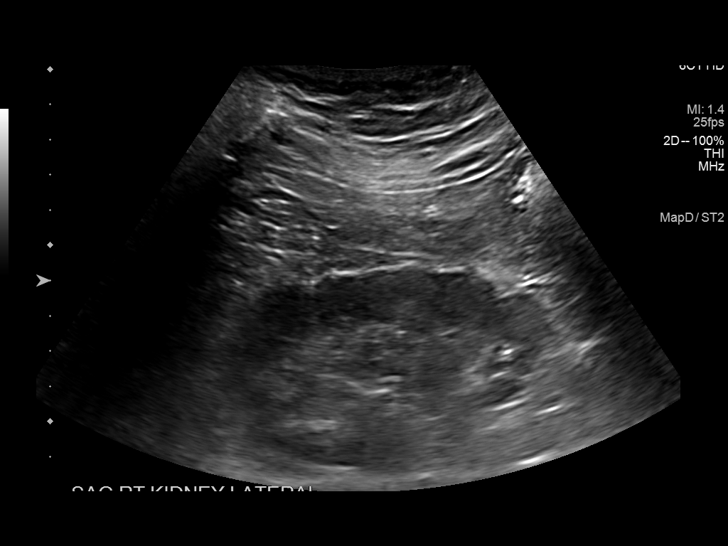
[im 18/40]
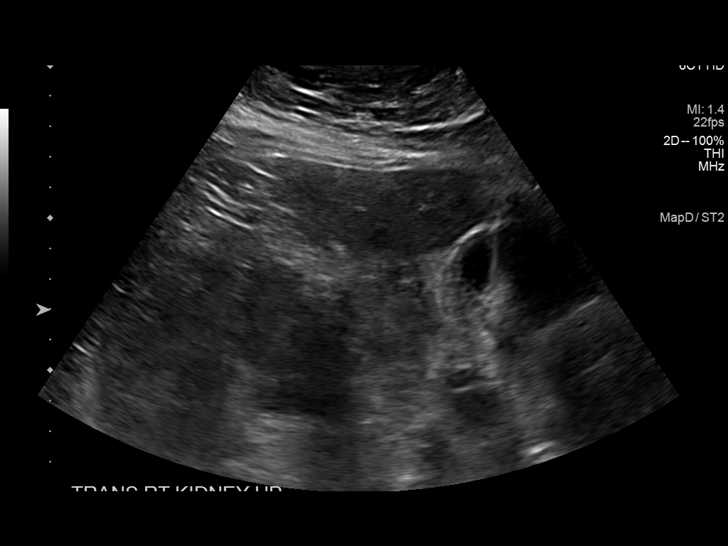
[im 22/40]
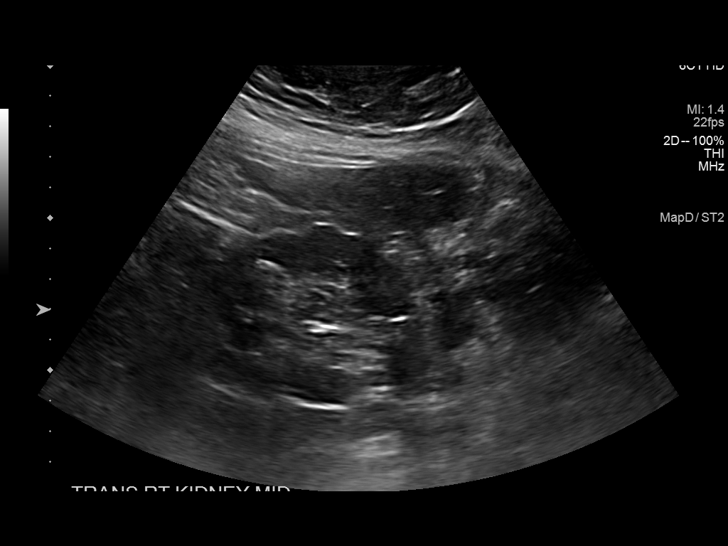
[im 25/40]
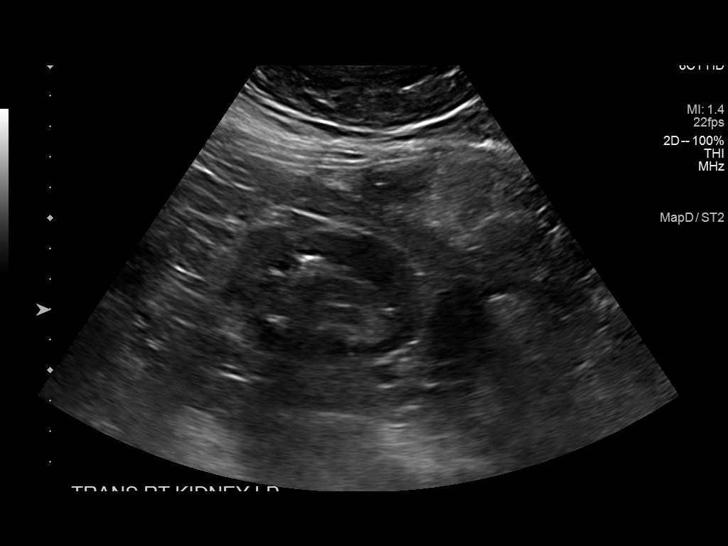
[im 27/40]
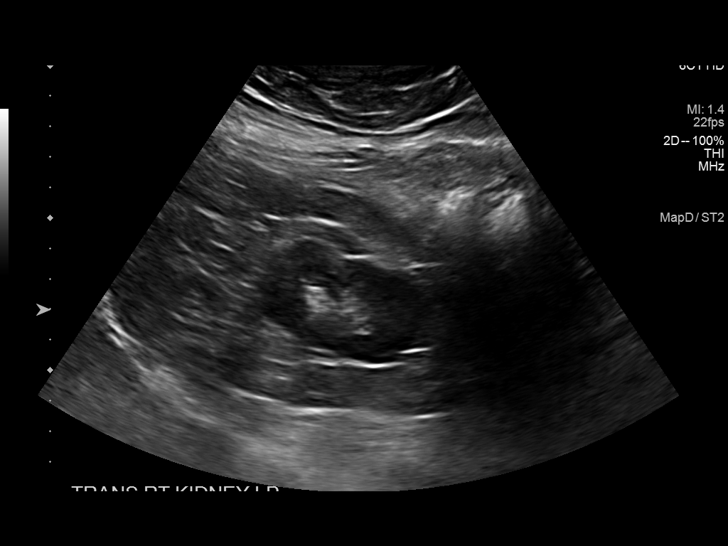
[im 30/40]
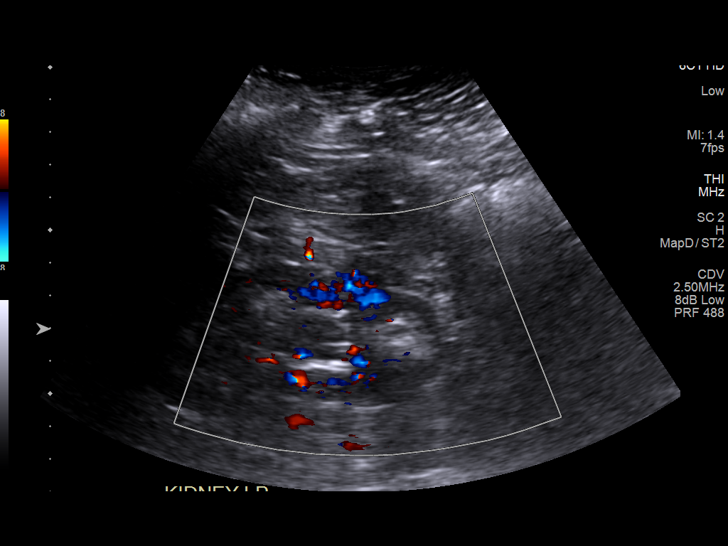
[im 33/40]
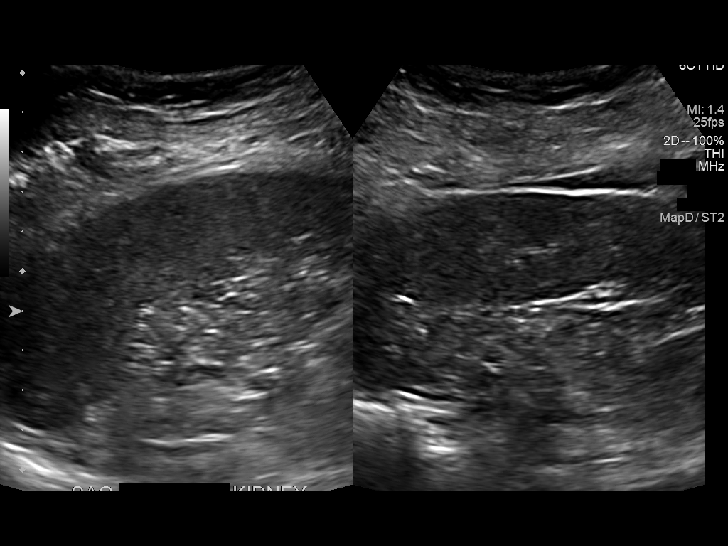
[im 36/40]
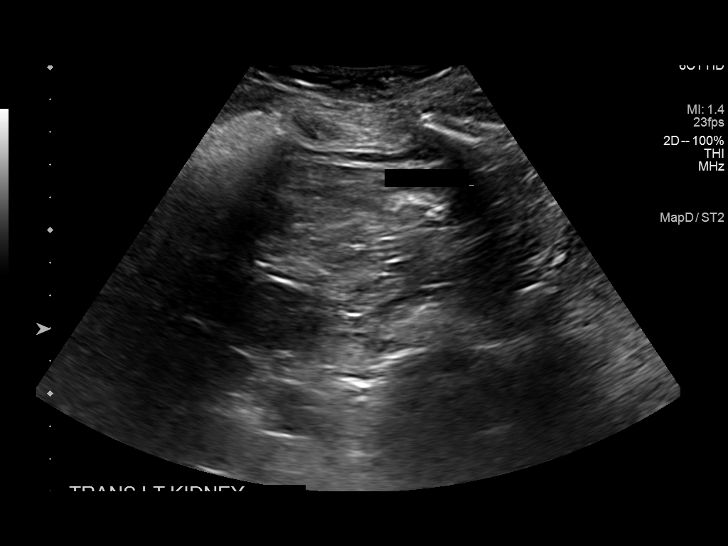
[im 40/40]
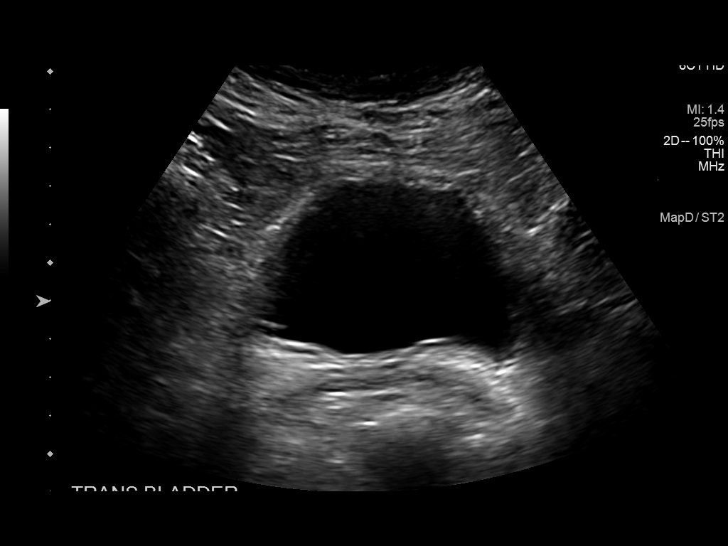

[14 of 25 positions shown; findings below may reference images not displayed]

FINDINGS: Right Kidney:

Renal measurements: 10.5 x 5.7 x 5.1 cm = volume: 160 mL. Lobulated
renal contours. Normal parenchymal echogenicity. No hydronephrosis.
There are scattered echogenic foci in the corticomedullary interface
without posterior shadowing. The lower pole renal calculi on prior
CT are not definitively seen, although potentially visualized
measuring 4 and 3 mm on my retrospective measurement. The small
renal cysts on prior CT are not well delineated.

Left Kidney:

Not visualized by ultrasound. A markedly atrophic kidney is seen on
prior CT.

Bladder:

Appears normal for degree of bladder distention.

Other:

None.
IMPRESSION: 1. Left kidney is not visualized by ultrasound, markedly atrophic on
prior CT.
2. No right hydronephrosis or suspicious renal abnormality. Right
renal stones on prior CT are possibly but not definitively seen on
the current exam.

## 2023-09-29 DIAGNOSIS — N3020 Other chronic cystitis without hematuria: Secondary | ICD-10-CM | POA: Diagnosis not present

## 2023-09-29 DIAGNOSIS — N200 Calculus of kidney: Secondary | ICD-10-CM | POA: Diagnosis not present

## 2023-10-16 DIAGNOSIS — H9313 Tinnitus, bilateral: Secondary | ICD-10-CM | POA: Diagnosis not present

## 2023-10-16 DIAGNOSIS — I10 Essential (primary) hypertension: Secondary | ICD-10-CM | POA: Diagnosis not present

## 2023-10-16 DIAGNOSIS — R251 Tremor, unspecified: Secondary | ICD-10-CM | POA: Diagnosis not present

## 2023-10-16 DIAGNOSIS — E785 Hyperlipidemia, unspecified: Secondary | ICD-10-CM | POA: Diagnosis not present

## 2023-10-16 DIAGNOSIS — G479 Sleep disorder, unspecified: Secondary | ICD-10-CM | POA: Diagnosis not present

## 2023-10-16 DIAGNOSIS — E039 Hypothyroidism, unspecified: Secondary | ICD-10-CM | POA: Diagnosis not present

## 2023-11-12 DIAGNOSIS — Z794 Long term (current) use of insulin: Secondary | ICD-10-CM | POA: Diagnosis not present

## 2023-11-12 DIAGNOSIS — E1165 Type 2 diabetes mellitus with hyperglycemia: Secondary | ICD-10-CM | POA: Diagnosis not present

## 2023-12-23 DIAGNOSIS — R1011 Right upper quadrant pain: Secondary | ICD-10-CM | POA: Diagnosis not present

## 2023-12-23 DIAGNOSIS — R197 Diarrhea, unspecified: Secondary | ICD-10-CM | POA: Diagnosis not present

## 2023-12-23 DIAGNOSIS — R300 Dysuria: Secondary | ICD-10-CM | POA: Diagnosis not present

## 2023-12-23 DIAGNOSIS — Z79899 Other long term (current) drug therapy: Secondary | ICD-10-CM | POA: Diagnosis not present

## 2023-12-25 DIAGNOSIS — R188 Other ascites: Secondary | ICD-10-CM | POA: Diagnosis not present

## 2023-12-25 DIAGNOSIS — R1011 Right upper quadrant pain: Secondary | ICD-10-CM | POA: Diagnosis not present

## 2024-01-01 DIAGNOSIS — N2581 Secondary hyperparathyroidism of renal origin: Secondary | ICD-10-CM | POA: Diagnosis not present

## 2024-01-01 DIAGNOSIS — D631 Anemia in chronic kidney disease: Secondary | ICD-10-CM | POA: Diagnosis not present

## 2024-01-01 DIAGNOSIS — R82991 Hypocitraturia: Secondary | ICD-10-CM | POA: Diagnosis not present

## 2024-01-01 DIAGNOSIS — I129 Hypertensive chronic kidney disease with stage 1 through stage 4 chronic kidney disease, or unspecified chronic kidney disease: Secondary | ICD-10-CM | POA: Diagnosis not present

## 2024-01-01 DIAGNOSIS — R109 Unspecified abdominal pain: Secondary | ICD-10-CM | POA: Diagnosis not present

## 2024-01-01 DIAGNOSIS — N1832 Chronic kidney disease, stage 3b: Secondary | ICD-10-CM | POA: Diagnosis not present

## 2024-01-01 DIAGNOSIS — E1122 Type 2 diabetes mellitus with diabetic chronic kidney disease: Secondary | ICD-10-CM | POA: Diagnosis not present

## 2024-03-09 ENCOUNTER — Telehealth: Payer: Self-pay | Admitting: Pharmacist

## 2024-03-09 NOTE — Progress Notes (Signed)
" ° °  03/09/2024  Patient ID: Megan Oliver, female   DOB: Jan 11, 1956, 68 y.o.   MRN: [id number]  Telephonic engagement with [name] today regarding patient assistance for Trulicity with [employer]. [name] informs that she has been approved for the 2026 year through her Endocrinologist office. [name] reports that at this time she does not plan to follow with Endocrinologist any longer due to controlled A1c, drive distance to practice and co-pay for specialist. Patient and PCP wish for PCP to take over prescribing the Trulicity for 2026.  Advised that I will look into having this changed. Will also plan to assist patient with re enrollment for 2027. Patient is appreciative of call.   Annabella Galla, PharmD Clinical Pharmacist [place] Direct Dial: [phone]  "

## 2024-03-10 ENCOUNTER — Telehealth: Payer: Self-pay

## 2024-03-10 NOTE — Telephone Encounter (Signed)
 Pt. Renewed herself online.  Faxed completed provider portion of application to [employer].  Patient is approved through 03/03/2025
